# Patient Record
Sex: Male | Born: 1975
Health system: Southern US, Community
[De-identification: ages and names within clinical notes are randomized; demographics above are authoritative.]

## PROBLEM LIST (undated history)

## (undated) DIAGNOSIS — C801 Malignant (primary) neoplasm, unspecified: Secondary | ICD-10-CM

## (undated) HISTORY — PX: APPENDECTOMY: SHX54

## (undated) HISTORY — DX: Malignant (primary) neoplasm, unspecified: C80.1

---

## 2002-06-14 ENCOUNTER — Emergency Department (HOSPITAL_COMMUNITY): Admission: EM | Admit: 2002-06-14 | Discharge: 2002-06-14 | Payer: Self-pay | Admitting: Emergency Medicine

## 2003-02-12 ENCOUNTER — Encounter (INDEPENDENT_AMBULATORY_CARE_PROVIDER_SITE_OTHER): Payer: Self-pay | Admitting: *Deleted

## 2003-02-12 ENCOUNTER — Inpatient Hospital Stay (HOSPITAL_COMMUNITY): Admission: RE | Admit: 2003-02-12 | Discharge: 2003-02-18 | Payer: Self-pay | Admitting: General Surgery

## 2003-02-26 ENCOUNTER — Ambulatory Visit (HOSPITAL_COMMUNITY): Admission: RE | Admit: 2003-02-26 | Discharge: 2003-02-26 | Payer: Self-pay | Admitting: Oncology

## 2003-02-26 ENCOUNTER — Encounter: Payer: Self-pay | Admitting: Oncology

## 2003-03-02 ENCOUNTER — Ambulatory Visit (HOSPITAL_BASED_OUTPATIENT_CLINIC_OR_DEPARTMENT_OTHER): Admission: RE | Admit: 2003-03-02 | Discharge: 2003-03-02 | Payer: Self-pay | Admitting: General Surgery

## 2003-03-02 ENCOUNTER — Encounter: Payer: Self-pay | Admitting: General Surgery

## 2003-06-22 ENCOUNTER — Ambulatory Visit (HOSPITAL_COMMUNITY): Admission: RE | Admit: 2003-06-22 | Discharge: 2003-06-22 | Payer: Self-pay | Admitting: Oncology

## 2003-06-22 ENCOUNTER — Encounter: Payer: Self-pay | Admitting: Oncology

## 2003-06-23 ENCOUNTER — Ambulatory Visit (HOSPITAL_COMMUNITY): Admission: RE | Admit: 2003-06-23 | Discharge: 2003-06-23 | Payer: Self-pay | Admitting: Oncology

## 2003-06-23 ENCOUNTER — Encounter: Payer: Self-pay | Admitting: Oncology

## 2003-10-09 ENCOUNTER — Ambulatory Visit (HOSPITAL_COMMUNITY): Admission: RE | Admit: 2003-10-09 | Discharge: 2003-10-09 | Payer: Self-pay | Admitting: Oncology

## 2003-11-01 ENCOUNTER — Inpatient Hospital Stay (HOSPITAL_COMMUNITY): Admission: EM | Admit: 2003-11-01 | Discharge: 2003-11-03 | Payer: Self-pay | Admitting: Emergency Medicine

## 2004-02-08 ENCOUNTER — Ambulatory Visit (HOSPITAL_COMMUNITY): Admission: RE | Admit: 2004-02-08 | Discharge: 2004-02-08 | Payer: Self-pay | Admitting: Oncology

## 2005-01-12 ENCOUNTER — Ambulatory Visit: Payer: Self-pay | Admitting: Oncology

## 2006-01-11 ENCOUNTER — Ambulatory Visit: Payer: Self-pay | Admitting: Oncology

## 2006-03-29 ENCOUNTER — Ambulatory Visit: Payer: Self-pay | Admitting: Oncology

## 2007-01-07 ENCOUNTER — Ambulatory Visit: Payer: Self-pay | Admitting: Oncology

## 2007-01-10 LAB — CBC WITH DIFFERENTIAL/PLATELET
Basophils Absolute: 0 10*3/uL (ref 0.0–0.1)
Eosinophils Absolute: 0.2 10*3/uL (ref 0.0–0.5)
HCT: 43.5 % (ref 38.7–49.9)
HGB: 15.4 g/dL (ref 13.0–17.1)
LYMPH%: 37.8 % (ref 14.0–48.0)
MCV: 87.3 fL (ref 81.6–98.0)
MONO%: 7.9 % (ref 0.0–13.0)
NEUT#: 3.4 10*3/uL (ref 1.5–6.5)
NEUT%: 51 % (ref 40.0–75.0)
Platelets: 271 10*3/uL (ref 145–400)

## 2007-01-10 LAB — COMPREHENSIVE METABOLIC PANEL
Alkaline Phosphatase: 66 U/L (ref 39–117)
BUN: 17 mg/dL (ref 6–23)
Glucose, Bld: 98 mg/dL (ref 70–99)
Total Bilirubin: 0.4 mg/dL (ref 0.3–1.2)

## 2007-02-02 ENCOUNTER — Inpatient Hospital Stay (HOSPITAL_COMMUNITY): Admission: EM | Admit: 2007-02-02 | Discharge: 2007-02-07 | Payer: Self-pay | Admitting: Emergency Medicine

## 2007-02-02 ENCOUNTER — Encounter (INDEPENDENT_AMBULATORY_CARE_PROVIDER_SITE_OTHER): Payer: Self-pay | Admitting: Specialist

## 2007-02-15 ENCOUNTER — Ambulatory Visit (HOSPITAL_COMMUNITY): Admission: RE | Admit: 2007-02-15 | Discharge: 2007-02-15 | Payer: Self-pay | Admitting: General Surgery

## 2007-02-15 ENCOUNTER — Inpatient Hospital Stay (HOSPITAL_COMMUNITY): Admission: EM | Admit: 2007-02-15 | Discharge: 2007-02-21 | Payer: Self-pay | Admitting: Emergency Medicine

## 2007-12-18 ENCOUNTER — Ambulatory Visit: Payer: Self-pay | Admitting: Oncology

## 2007-12-18 LAB — COMPREHENSIVE METABOLIC PANEL
Albumin: 4.6 g/dL (ref 3.5–5.2)
Alkaline Phosphatase: 72 U/L (ref 39–117)
BUN: 16 mg/dL (ref 6–23)
CO2: 23 mEq/L (ref 19–32)
Glucose, Bld: 84 mg/dL (ref 70–99)
Potassium: 4.1 mEq/L (ref 3.5–5.3)

## 2007-12-18 LAB — CBC WITH DIFFERENTIAL/PLATELET
Basophils Absolute: 0 10*3/uL (ref 0.0–0.1)
Eosinophils Absolute: 0.1 10*3/uL (ref 0.0–0.5)
HGB: 15.1 g/dL (ref 13.0–17.1)
LYMPH%: 41.2 % (ref 14.0–48.0)
MCV: 87.2 fL (ref 81.6–98.0)
MONO#: 0.4 10*3/uL (ref 0.1–0.9)
MONO%: 6.4 % (ref 0.0–13.0)
NEUT#: 3.3 10*3/uL (ref 1.5–6.5)
Platelets: 298 10*3/uL (ref 145–400)
RDW: 13.1 % (ref 11.2–14.6)
WBC: 6.5 10*3/uL (ref 4.0–10.0)

## 2007-12-18 LAB — LACTATE DEHYDROGENASE: LDH: 124 U/L (ref 94–250)

## 2008-06-17 IMAGING — CT CT ABD-PELV W/O CM
2 of 4 series · 15 of 42 positions shown, 19 images · non-contrast
Comparison: NONE

CLINICAL DATA: Right lower quadrant pain.  Evaluate for 
appendicitis.  

CT ABDOMEN AND PELVIS WITHOUT INTRAVENOUS OR ORAL CONTRAST
TECHNIQUE: Multiple axial 3 millimeter thick slices at 3 
millimeter increments were obtained from the lung base through the 
pelvis.

[Series 2: wo · axial · 0.76mm/px · z∈[+459,+882]mm · 12 of 157 slices shown, 16 images]
[im 8/157  soft-tissue]
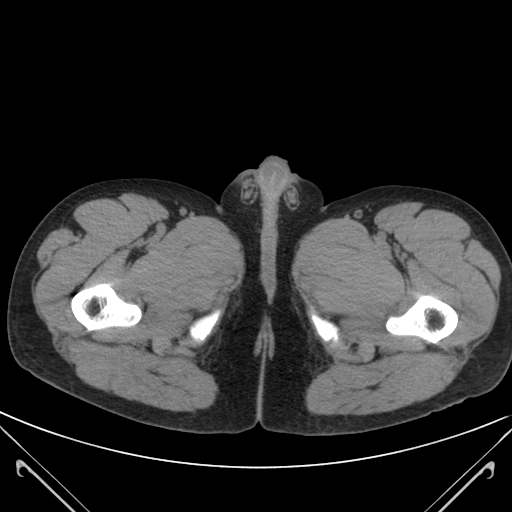
[im 8/157  bone]
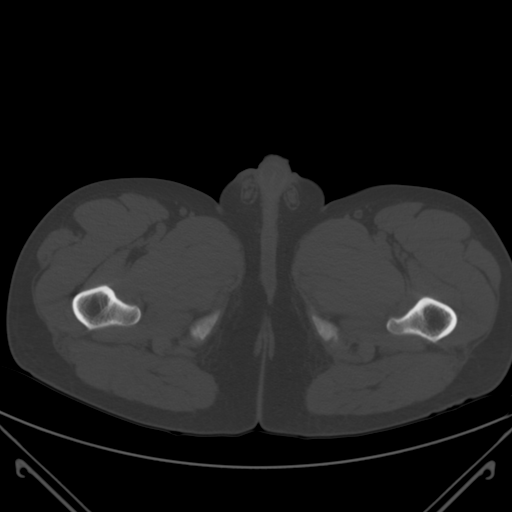
[im 24/157  soft-tissue]
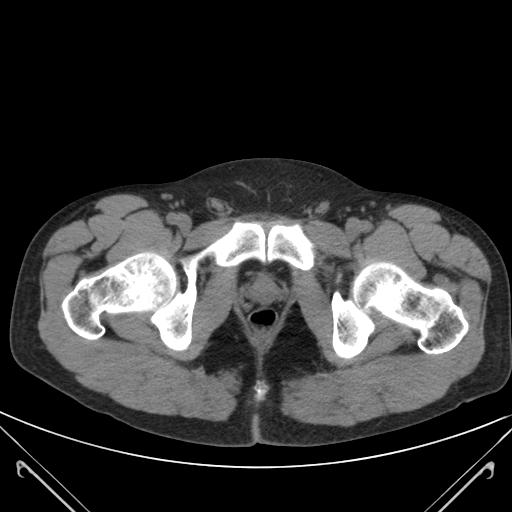
[im 40/157  soft-tissue]
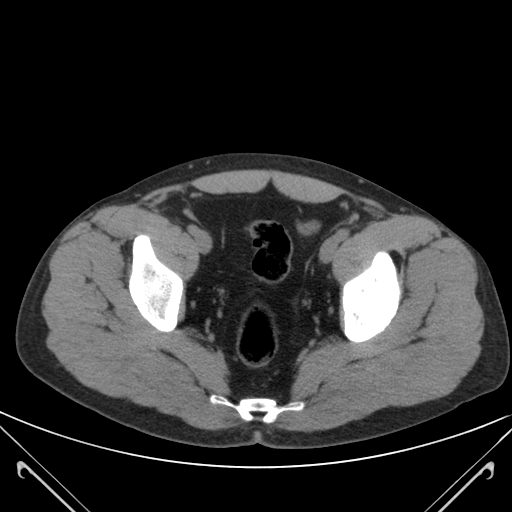
[im 55/157  soft-tissue]
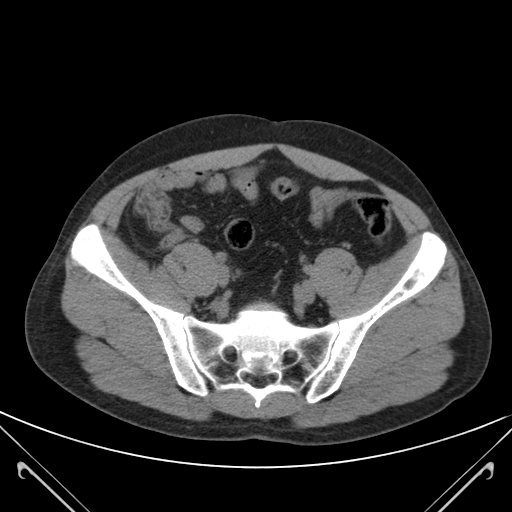
[im 71/157  soft-tissue]
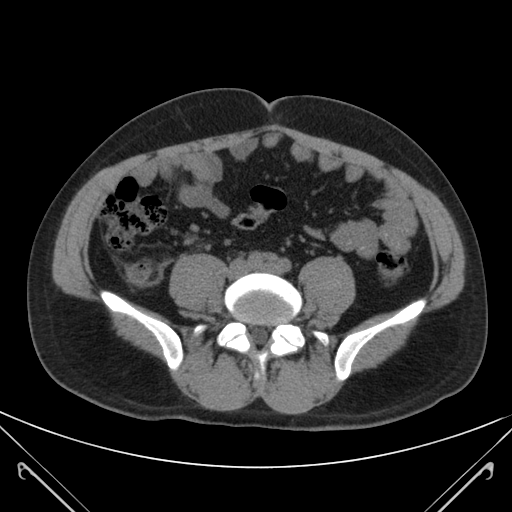
[im 86/157  soft-tissue]
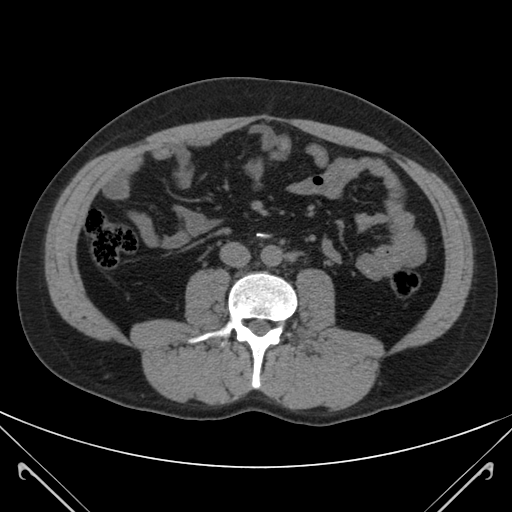
[im 102/157  soft-tissue]
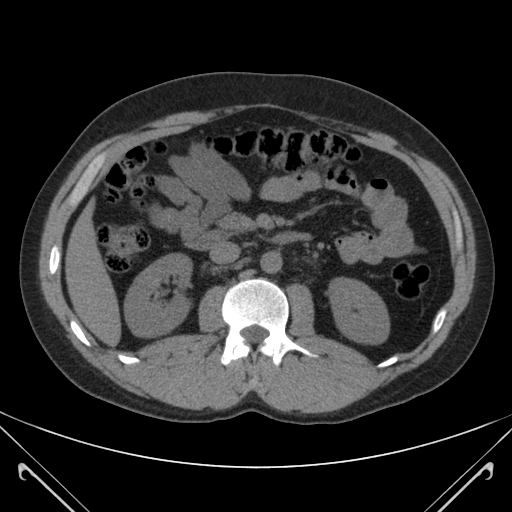
[im 118/157  soft-tissue]
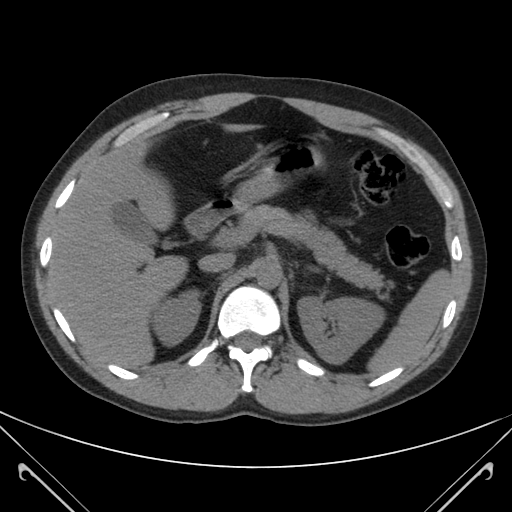
[im 125/157  lung]
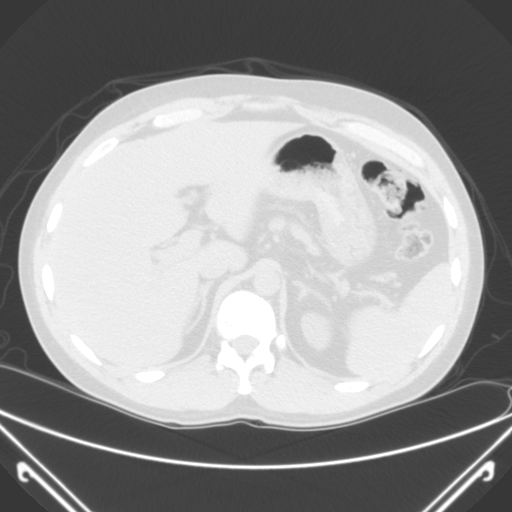
[im 133/157  soft-tissue]
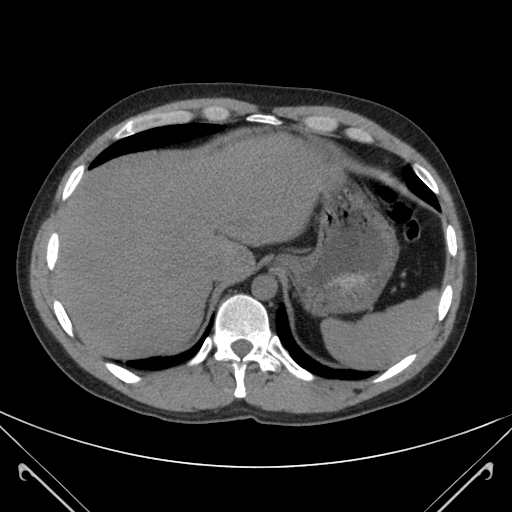
[im 133/157  lung]
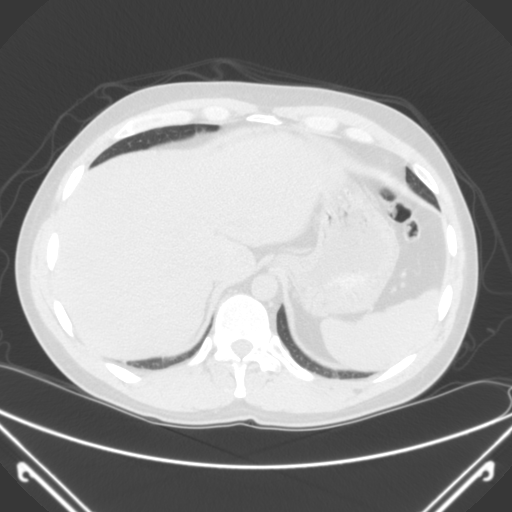
[im 133/157  bone]
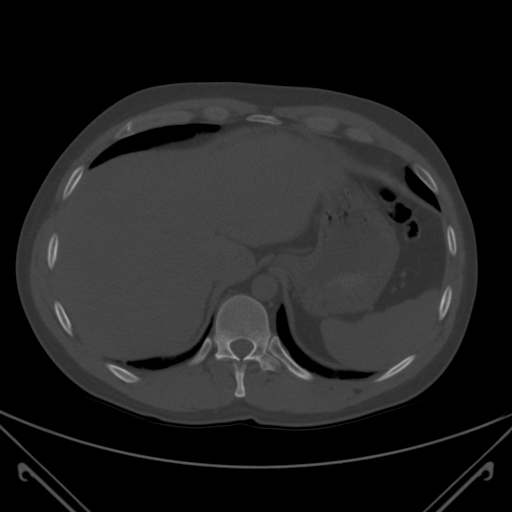
[im 141/157  lung]
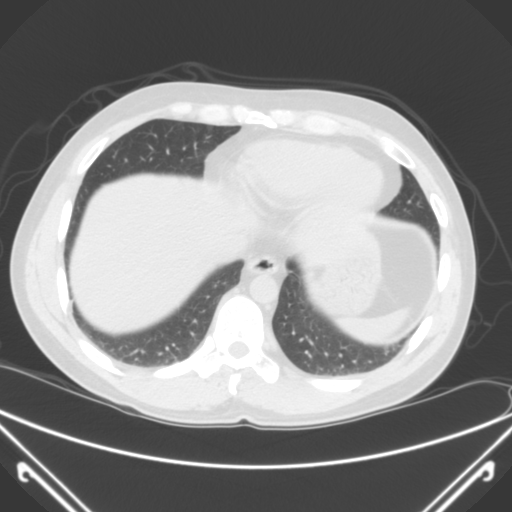
[im 149/157  soft-tissue]
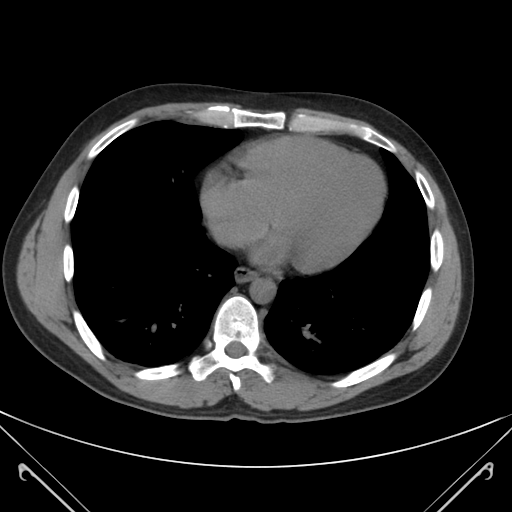
[im 149/157  lung]
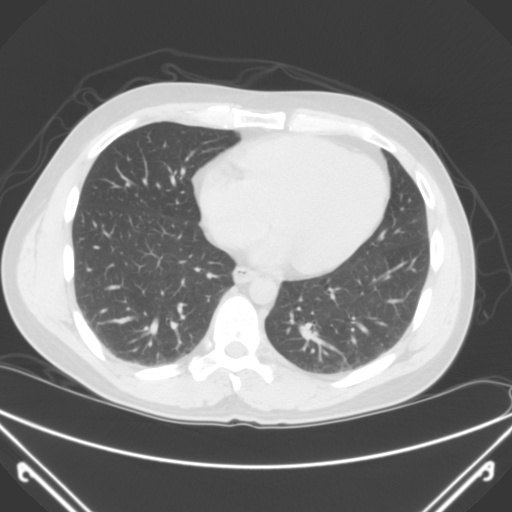

[Series 8038: coronals · coronal · 0.91mm/px · 3 of 79 slices shown]
[im 27/79  soft-tissue]
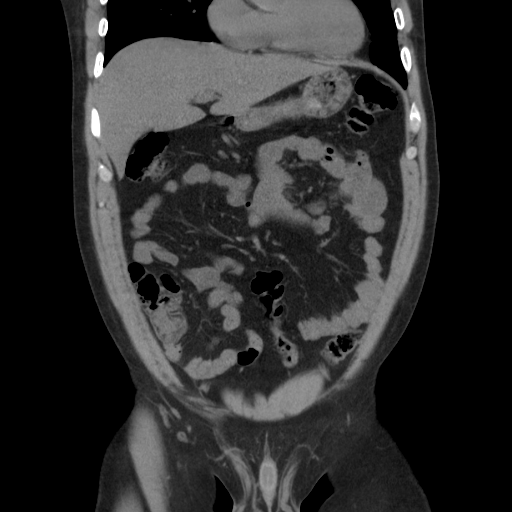
[im 35/79  soft-tissue]
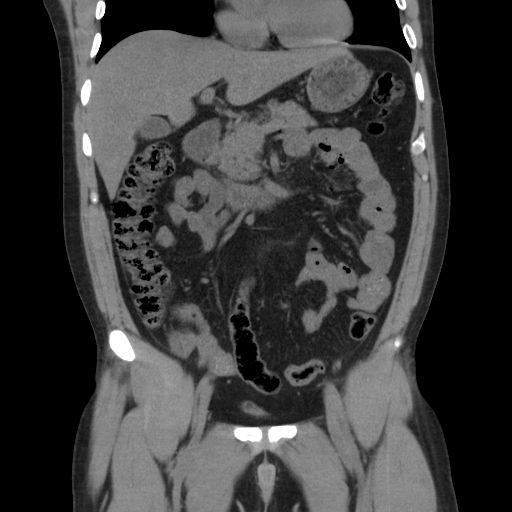
[im 44/79  soft-tissue]
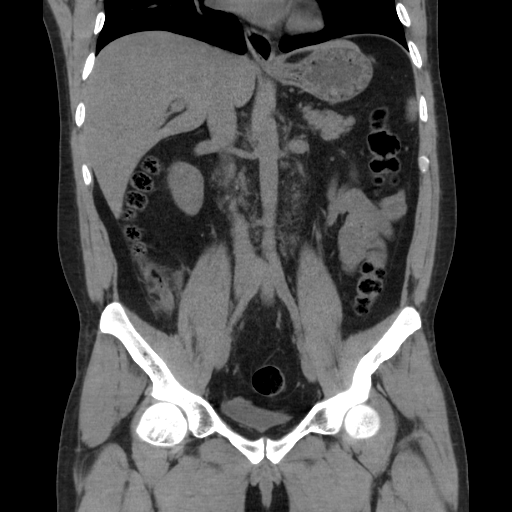

[15 of 42 positions shown; findings below may reference images not displayed]

FINDINGS: No gallstones or renal calculi.  The liver, pancreas, 
and spleen are unremarkable.  No upper abdominal or 
retroperitoneal mass, adenopathy, or aneurysm. No evidence of 
adrenal or renal mass or renal obstruction. The appendix is 
dilated and demonstrates extensive periappendiceal stranding as 
well as a probable small periappendiceal abscess.  No evidence of 
diverticulitis, hernia, or bowel obstruction.  No lung base mass, 
infiltrate, edema, or effusion.  No lytic or blastic lesions.
IMPRESSION: Findings compatible with appendicitis with small 
periappendiceal abscess.  The abscess collection measures 
reviewed on 02/04/2007 Dict Date: 02/04/2007  Tran Date: 
02/04/2007 DAS  JLM

## 2010-12-31 ENCOUNTER — Encounter: Payer: Self-pay | Admitting: Internal Medicine

## 2011-01-01 ENCOUNTER — Encounter: Payer: Self-pay | Admitting: Oncology

## 2011-04-28 NOTE — H&P (Signed)
[name], [name] NO.:  [id number]   MEDICAL RECORD NO.:  [id number]          PATIENT TYPE:  INP   LOCATION:  5710                         FACILITY:  [facility]   PHYSICIAN:  Cherylynn Ridges, M.D.    DATE OF BIRTH:  25-Oct-1976   DATE OF ADMISSION:  02/15/2007  DATE OF DISCHARGE:                              HISTORY & PHYSICAL   IDENTIFICATION/CHIEF COMPLAINT:  The patient is a 35 year old gentleman  who underwent a laparoscopic appendectomy on February 02, 2007, now  being readmitted with a right lower quadrant abscess.   HISTORY OF PRESENT ILLNESS:  The patient has been having right lower  quadrant pain since the time of surgery.  It has worsened over the last  several days to where he came into the urgent office of [facility]  [facility] today, was evaluated, and found to have what was thought to be  some burning with urination and dysuria.  He also said he had very foul-  smelling urine.  He was sent over for UA with micro and a CBC with  differential.  Those results were called in to me.  His UA was  unremarkable.  However, he had a white count of 19.5 thousand.  His  hemoglobin/hematocrit only slightly low.  He was sent for a CT scan  which demonstrated a right lower quadrant abscess collection, and I am  admitting the patient for IV antibiotics and probable percutaneous  drainage using CT guidance tomorrow.   PAST MEDICAL HISTORY:  Significant for:  1. Nodular sclerosing clots and Hodgkin disease, diagnosis in 2004,      through an open excisional biopsy of a large retroperitoneal mass.      a.     He has gone though chemotherapy and apparently is clear of       that disease.  2. He has also had a ruptured appendix, treated on February 02, 2007.   He takes no medications.   HE HAS NO KNOWN DRUG ALLERGIES.   REVIEW OF SYSTEMS:  He ate normally, had a normal bowel movement while  in the emergency room today, otherwise has been having fevers up to 101  at home with some chills.   PHYSICAL EXAMINATION:  GENERAL:  He looks somewhat sickly and pale but  not in a lot of acute distress.  VITAL SIGNS:  His pulse is 110, blood pressure is normal, temperature  101.9.  HEENT:  He is pale, normocephalic and atraumatic.  He has no scleral  icterus.  NECK:  Supple.  He has no cervical adenopathy.  LUNGS:  Clear to auscultation.  CARDIAC:  Tachycardiac with no murmurs.  ABDOMEN:  Distended with hypoactive but present bowel sounds, and a  fullness and palpable mass in the right lower quadrant in the area where  he has the abscess on CT.  RECTAL: Not performed.  NEUROLOGIC:  Cranial nerves II-XII grossly intact.   The only laboratory studies available at this time is a CBC with diff,  showing a white count of 19,000, hemoglobin of 13, hematocrit of 37.   I have reviewed the CT scan  which shows multiloculated fluid collections  in the right lower quadrant with one large and one predominantly.  This  is amenable to percutaneous drainage per Dr. Kearney Hard in interventional  radiology.   IMPRESSION:  Right lower quadrant abscess, status post laparoscopic  appendectomy for purportedly ruptured appendix.   PLAN:  Admit the patient for IV antibiotics, IV hydration, pain control,  and subsequent percutaneous drainage per radiology.  I have explained  this to the patient and his family, who actually had wished to go over  to [facility], however, I told them that they had no beds  available for them to do so.  His main reason for wanted to go over  there was primarily because it is close to the home of his family.      Cherylynn Ridges, M.D.  Electronically Signed     JOW/MEDQ  D:  02/15/2007  T:  02/16/2007  Job:  [id number]

## 2011-04-28 NOTE — Op Note (Signed)
[name], [name] NO.:  [id number]   MEDICAL RECORD NO.:  [id number]          PATIENT TYPE:  INP   LOCATION:  2550                         FACILITY:  [facility]   PHYSICIAN:  Velora Heckler, MD      DATE OF BIRTH:  15-Oct-1976   DATE OF PROCEDURE:  02/02/2007  DATE OF DISCHARGE:                               OPERATIVE REPORT   PREOPERATIVE DIAGNOSIS:  Acute appendicitis.   POSTOPERATIVE DIAGNOSIS:  Acute appendicitis with perforation.   PROCEDURE:  Laparoscopic appendectomy.   SURGEON:  Velora Heckler, M.D., FACS   ANESTHESIA:  General per Dr. Jairo Ben.   ESTIMATED BLOOD LOSS:  Minimal.   PREPARATION:  Betadine.   COMPLICATIONS:  None.   INDICATIONS:  The patient is a 35 year old male [place] who  presents with a 24-hour history of abdominal pain localized to the right  lower quadrant.  He was initially seen at [facility].  CT scan  of the abdomen and pelvis at [facility] demonstrated  findings consistent with acute appendicitis.  The patient was sent to  [facility] for surgical evaluation and management.  The patient  was seen and evaluated in the emergency room and prepared for the  operating room.   DESCRIPTION OF PROCEDURE:  The procedure was done in OR #16 at the [facility].  The patient is brought to the operating room  and placed in a supine position on the operating room table.  Following  administration of general anesthesia, the patient is prepped and draped  in the usual strict aseptic fashion.   After ascertaining that an adequate level of anesthesia had been  obtained, the patient's previous midline scar is reopened with a #15  blade for 3 cm just above the umbilicus in the midline.  Dissection is  carried down through scar tissue and subcutaneous tissues to the fascia.  The fascia is incised, and the peritoneal cavity is entered cautiously.  A 0 Vicryl pursestring suture  is placed in the fascia.  An Hasson  cannula is introduced and secured with a pursestring suture.  The  abdomen is insufflated with carbon dioxide.  The laparoscope is  introduced and the abdomen explored.  There appears to be one adhesion  of omentum to the anterior abdominal wall.  Operative ports are placed  in the right upper quadrant and left lower quadrant.  Adhesions are  taken down.  The cecum is then gently mobilized.  The appendix is  visible, although it tracks retrocecally along the lateral colic gutter  in a retroperitoneal location.  Using the harmonic scalpel, the  retroperitoneum is opened lateral to the appendix.  The base of the  appendix is mobilized.  A window is created at the base of the appendix,  and the base of the appendix is transected with an Endo-GIA stapler  using a vascular cartridge.  The mesoappendix is then taken down near  the wall of the appendix with the harmonic scalpel.  Care is taken to  avoid the cecal wall.  The peritoneum is opened along the  edge of the  appendix.  At  approximately the midportion of the appendix, however,  there is a collection of purulent fluid which erupts with opening of the  retroperitoneum.  This is aspirated and evacuated.  The appendix, with  gentle traction, actually separates into two pieces at this point.  The  initial proximal segment of the appendix is then placed in an EndoCatch  bag and withdrawn from the peritoneal cavity.  The remaining distal  extent of the appendix is grasped with a grasper and elevated.  Using  the harmonic scalpel, it is dissected out of the retroperitoneum in its  entirety and placed into an EndoCatch bag and withdrawn from the  peritoneal cavity.  The operative field is irrigated with warm saline.  Good hemostasis is noted.  Two liters of irrigation are used to irrigate  the operative site.  A large amount of this is evacuated.  A 19-French  Blake drain is then brought in through the right  upper quadrant stab  wound and placed within the operative field along the pelvis, the base  of the cecum, and the lateral colic gutter.  The drain is secured to the  skin with a 3-0 nylon suture and placed to bulb suction.  The remainder  of the abdomen is irrigated and irrigation fluid evacuated.  The ports  are removed under direct vision.  Pneumoperitoneum is released.  The  0  Vicryl pursestring suture is tied securely.  The port sites are  anesthetized with local anesthetic.  The wounds are closed with  interrupted 4-0 Vicryl subcuticular sutures.  The wounds are washed and  dried, and benzoin and Steri-Strips are applied.  Sterile dressings are  applied.   The patient is awakened from anesthesia and brought to the recovery room  in stable condition.  The patient tolerated the procedure well.      Velora Heckler, MD  Electronically Signed     TMG/MEDQ  D:  02/02/2007  T:  02/03/2007  Job:  [id number]

## 2011-04-28 NOTE — Discharge Summary (Signed)
[name], RENSTROM NO.:  [id number]   MEDICAL RECORD NO.:  [id number]          PATIENT TYPE:  INP   LOCATION:  5030                         FACILITY:  [facility]   PHYSICIAN:  Velora Heckler, MD      DATE OF BIRTH:  06/12/1976   DATE OF ADMISSION:  02/02/2007  DATE OF DISCHARGE:  02/07/2007                               DISCHARGE SUMMARY   REASON FOR ADMISSION:  Acute appendicitis.   HISTORY OF PRESENT ILLNESS:  The patient is a 35 year old male from [place], presenting to the emergency room with a 24-hour history of  abdominal pain, localizing to the right lower quadrant.  [facility]  [facility] scan at [facility]  demonstrated findings consistent with acute appendicitis.  The patient  was referred to [facility] for surgical management.   HOSPITAL COURSE:  The patient was seen and evaluated in the emergency  department.  He was admitted and started on intravenous antibiotics.  The patient was prepared and taken to the operating room on February 02, 2007, where he underwent a laparoscopic appendectomy for acute  appendicitis with perforation.   His postoperative course was relatively straightforward.  The patient  did have a fever.  He received intravenous antibiotics.  He was started  on a clear liquid diet.  He was advanced to a full liquid diet on the  third postoperative day.  The patient was seen in consultation by Dr.  Valentino Hue. Magrinat from medical oncology, to do his history of Hodgkin's  lymphoma.  The patient continued steady progress and was prepared for  discharge home on the fifth postoperative day.   DISCHARGE PLANNING:  The patient is discharged home on February 07, 2007, in good condition, tolerating a regular diet and ambulating  independently.  The patient will be seen back in our office at [facility] in two weeks.   DISCHARGE MEDICATIONS:  1. Augmentin 875 mg bid for  five days.  2. Vicodin p.r.n. pain.   DISCHARGE DIAGNOSES:  Acute suppurative appendicitis with perforation.   CONDITION ON DISCHARGE:  Improved.      Velora Heckler, MD  Electronically Signed     TMG/MEDQ  D:  03/12/2007  T:  03/12/2007  Job:  [id number]

## 2015-07-18 ENCOUNTER — Other Ambulatory Visit: Payer: Self-pay | Admitting: Physician Assistant

## 2015-07-18 ENCOUNTER — Ambulatory Visit (INDEPENDENT_AMBULATORY_CARE_PROVIDER_SITE_OTHER): Payer: Self-pay

## 2015-07-18 ENCOUNTER — Ambulatory Visit (INDEPENDENT_AMBULATORY_CARE_PROVIDER_SITE_OTHER): Payer: Self-pay | Admitting: Family Medicine

## 2015-07-18 VITALS — BP 110/60 | HR 97 | Temp 98.3°F | Resp 18 | Ht 66.0 in | Wt 160.2 lb

## 2015-07-18 DIAGNOSIS — R519 Headache, unspecified: Secondary | ICD-10-CM

## 2015-07-18 DIAGNOSIS — H21562 Pupillary abnormality, left eye: Secondary | ICD-10-CM

## 2015-07-18 DIAGNOSIS — R51 Headache: Secondary | ICD-10-CM

## 2015-07-18 DIAGNOSIS — R52 Pain, unspecified: Secondary | ICD-10-CM

## 2015-07-18 LAB — PROTIME-INR
INR: 0.98 (ref <1.50)
Prothrombin Time: 13 seconds (ref 11.6–15.2)

## 2015-07-18 MED ORDER — IBUPROFEN 800 MG PO TABS: ORAL_TABLET | ORAL | Status: DC
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
## 2015-07-25 NOTE — Progress Notes (Signed)
Patient ID: [name], male   DOB: 1976-09-21, 39 y.o.   MRN: [id number] Pt independently examined by myself and Dr. Ouida Sills. Reviewed documentation and xray and agree w/ assessment and plan. Delman Cheadle, MD MPH

## 2018-03-29 ENCOUNTER — Encounter: Payer: Self-pay | Admitting: Urgent Care

## 2018-04-04 ENCOUNTER — Emergency Department (HOSPITAL_COMMUNITY)
Admission: EM | Admit: 2018-04-04 | Discharge: 2018-04-04 | Discharge status: Absent without leave | Payer: BLUE CROSS/BLUE SHIELD

## 2018-04-04 ENCOUNTER — Ambulatory Visit: Payer: Self-pay | Admitting: Family Medicine

## 2018-04-04 DIAGNOSIS — J09X9 Influenza due to identified novel influenza A virus with other manifestations: Secondary | ICD-10-CM | POA: Diagnosis not present

## 2018-04-05 ENCOUNTER — Ambulatory Visit: Payer: Self-pay | Admitting: *Deleted

## 2018-04-05 NOTE — Telephone Encounter (Signed)
Pt's son called to report pt "has fever." Pt on line with son who is translating.  Pt states he was seen in [facility] and diagnosed with the flu. Unable to find documentation relating to this. States he was given medication to take; unsure of name.  Reports presently "feels" warm, does not have thermometer. Reports diarrhea past 2 days, "none today." Intermittent stomach pain, 9/16 at umbilicus. Denies nausea, vomiting. Denies any cough/congestion, no SOB. Appt made for tomorrow with Dr. Mitchel Honour. Care advise given per protocol as well as instructions to stay hydrated, eat bland diet.  Reason for Disposition . [1] MODERATE pain (e.g., interferes with normal activities) AND [2] pain comes and goes (cramps) AND [3] present > 24 hours  (Exception: pain with Vomiting or Diarrhea - see that Guideline)  Answer Assessment - Initial Assessment Questions 1. LOCATION: "Where does it hurt?"      At umbilicus 2. RADIATION: "Does the pain shoot anywhere else?" (e.g., chest, back)     no 3. ONSET: "When did the pain begin?" (Minutes, hours or days ago)      2 days ago 4. SUDDEN: "Gradual or sudden onset?"     gradual 5. PATTERN "Does the pain come and go, or is it constant?"    - If constant: "Is it getting better, staying the same, or worsening?"      (Note: Constant means the pain never goes away completely; most serious pain is constant and it progresses)     - If intermittent: "How long does it last?" "Do you have pain now?"     (Note: Intermittent means the pain goes away completely between bouts)    Comes and goes 6. SEVERITY: "How bad is the pain?"  (e.g., Scale 1-10; mild, moderate, or severe)    - MILD (1-3): doesn't interfere with normal activities, abdomen soft and not tender to touch     - MODERATE (4-7): interferes with normal activities or awakens from sleep, tender to touch     - SEVERE (8-10): excruciating pain, doubled over, unable to do any normal activities      5/10 7.  RECURRENT SYMPTOM: "Have you ever had this type of abdominal pain before?" If so, ask: "When was the last time?" and "What happened that time?"      unsure 8. CAUSE: "What do you think is causing the abdominal pain?"     unsure 9. RELIEVING/AGGRAVATING FACTORS: "What makes it better or worse?" (e.g., movement, antacids, bowel movement)      10. OTHER SYMPTOMS: "Has there been any vomiting, diarrhea, constipation, or urine problems?"       Diarrhea x 2 days, but "None today"  "Feels warm"  Protocols used: ABDOMINAL PAIN - MALE-A-AH

## 2018-04-06 ENCOUNTER — Ambulatory Visit: Payer: BLUE CROSS/BLUE SHIELD | Admitting: Emergency Medicine

## 2018-04-06 ENCOUNTER — Encounter: Payer: Self-pay | Admitting: Emergency Medicine

## 2018-04-06 ENCOUNTER — Other Ambulatory Visit: Payer: Self-pay

## 2018-04-06 VITALS — BP 110/60 | HR 94 | Temp 99.5°F | Resp 16 | Ht 66.0 in | Wt 167.6 lb

## 2018-04-06 DIAGNOSIS — J111 Influenza due to unidentified influenza virus with other respiratory manifestations: Secondary | ICD-10-CM | POA: Insufficient documentation

## 2018-04-06 DIAGNOSIS — R1084 Generalized abdominal pain: Secondary | ICD-10-CM | POA: Diagnosis not present

## 2018-04-06 DIAGNOSIS — J302 Other seasonal allergic rhinitis: Secondary | ICD-10-CM | POA: Diagnosis not present

## 2018-04-06 MED ORDER — CETIRIZINE HCL 10 MG PO TABS: 10.0000 mg | ORAL_TABLET | Freq: Every day | ORAL | 11 refills | Status: DC

## 2018-04-06 MED ORDER — TRIAMCINOLONE ACETONIDE 55 MCG/ACT NA AERO: 2.0000 | INHALATION_SPRAY | Freq: Every day | NASAL | 12 refills | Status: DC

## 2018-04-06 NOTE — Progress Notes (Signed)
[name] 42 y.o.   Chief Complaint  Patient presents with  . Abdominal Pain    x 3 days, no vomiting, diarrhea x 1 day, "It's better", was given Tamiflu on 04/04/18    HISTORY OF PRESENT ILLNESS: This is a 43 y.o. male recently diagnosed with influenza and started on Tamiflu 2 days ago when he was seen at an urgent care center.  Was very sick in the first 48 hours.  Feels better today.  Had some abdominal pain with nausea yesterday.  Had a little bit of diarrhea also.  Overall feeling better.  Has a history of seasonal allergies and also a remote history of stomach cancer.  Able to eat and drink.  No vomiting.  Had fever and chills but no longer.  No other significant symptoms.  HPI   Prior to Admission medications   Medication Sig Start Date End Date Taking? Authorizing Provider  acetaminophen (TYLENOL) 500 MG tablet Take 500 mg by mouth every 6 (six) hours as needed.   Yes [provider]  oseltamivir (TAMIFLU) 75 MG capsule Take 75 mg by mouth.   Yes [provider]    No Known Allergies  There are no active problems to display for this patient.   No past medical history on file.    Social History   Socioeconomic History  . Marital status: Single    Spouse name: Not on file  . Number of children: Not on file  . Years of education: Not on file  . Highest education level: Not on file  Occupational History  . Not on file  Social Needs  . Financial resource strain: Not on file  . Food insecurity:    Worry: Not on file    Inability: Not on file  . Transportation needs:    Medical: Not on file    Non-medical: Not on file  Tobacco Use  . Smoking status: Never Smoker  . Smokeless tobacco: Never Used  Substance and Sexual Activity  . Alcohol use: Yes    Alcohol/week: 0.0 oz  . Drug use: Not on file  . Sexual activity: Not on file  Lifestyle  . Physical activity:    Days per week: Not on file    Minutes per session: Not on file    . Stress: Not on file  Relationships  . Social connections:    Talks on phone: Not on file    Gets together: Not on file    Attends religious service: Not on file    Active member of club or organization: Not on file    Attends meetings of clubs or organizations: Not on file    Relationship status: Not on file  . Intimate partner violence:    Fear of current or ex partner: Not on file    Emotionally abused: Not on file    Physically abused: Not on file    Forced sexual activity: Not on file  Other Topics Concern  . Not on file  Social History Narrative  . Not on file    No family history on file.   Review of Systems  Constitutional: Positive for chills, fever and malaise/fatigue.  HENT: Positive for sore throat. Negative for congestion, ear pain and nosebleeds.   Eyes: Negative.  Negative for blurred vision, double vision, discharge and redness.  Respiratory: Positive for cough. Negative for sputum production, shortness of breath and wheezing.   Cardiovascular: Negative.  Negative for chest pain, palpitations and leg swelling.  Gastrointestinal: Positive for abdominal pain, diarrhea and nausea. Negative for blood in stool, melena and vomiting.  Genitourinary: Negative.  Negative for dysuria and hematuria.  Musculoskeletal: Positive for myalgias. Negative for back pain and neck pain.  Skin: Negative.  Negative for rash.  Neurological: Negative.  Negative for dizziness and headaches.  Endo/Heme/Allergies: Negative.   All other systems reviewed and are negative.   Vitals:   04/06/18 0853  BP: 110/60  Pulse: 94  Resp: 16  Temp: 99.5 F (37.5 C)  SpO2: 98%    Physical Exam  Constitutional: He is oriented to person, place, and time. He appears well-developed and well-nourished.  HENT:  Head: Normocephalic and atraumatic.  Right Ear: External ear normal.  Left Ear: External ear normal.  Nose: Nose normal.  Mouth/Throat: Oropharynx is clear and moist.  Eyes: Pupils  are equal, round, and reactive to light. Conjunctivae and EOM are normal.  Neck: Normal range of motion. Neck supple.  Cardiovascular: Normal rate, regular rhythm and normal heart sounds.  Pulmonary/Chest: Effort normal and breath sounds normal.  Abdominal: Soft. Bowel sounds are normal. He exhibits no distension and no mass. There is no tenderness. There is no guarding.  Old vertical surgical scar  Musculoskeletal: Normal range of motion. He exhibits no edema.  Lymphadenopathy:    He has no cervical adenopathy.  Neurological: He is alert and oriented to person, place, and time. No sensory deficit. He exhibits normal muscle tone.  Skin: Skin is warm and dry. Capillary refill takes less than 2 seconds. No rash noted.  Psychiatric: He has a normal mood and affect. His behavior is normal.  Vitals reviewed.   A total of 25 minutes was spent in the room with the patient, greater than 50% of which was in counseling/coordination of care regarding differential diagnosis and understanding of what influenza is and what to expect from it.  ASSESSMENT & PLAN: [name] was seen today for abdominal pain.  Diagnoses and all orders for this visit:  Influenza  Seasonal allergies -     cetirizine (ZYRTEC) 10 MG tablet; Take 1 tablet (10 mg total) by mouth daily. -     triamcinolone (NASACORT) 55 MCG/ACT AERO nasal inhaler; Place 2 sprays into the nose daily.  Generalized abdominal pain Comments: Resolved   Continue on finish Tamiflu.  Return here if worse. Patient Instructions       IF you received an x-ray today, you will receive an invoice from [facility]. Please contact [facility] at [phone] with questions or concerns regarding your invoice.   IF you received labwork today, you will receive an invoice from [place]. Please contact [facility] at [phone] with questions or concerns regarding your invoice.   Our billing staff will not be able to assist you with  questions regarding bills from these companies.  You will be contacted with the lab results as soon as they are available. The fastest way to get your results is to activate your My Chart account. Instructions are located on the last page of this paperwork. If you have not heard from [place] regarding the results in 2 weeks, please contact this office.     Gripe en los adultos (Influenza, Adult) La gripe es una infeccin en los pulmones, la nariz y la garganta (vas respiratorias). La causa un virus. La gripe provoca muchos sntomas del resfro comn, as como fiebre alta y Hydrologist. Puede hacer que se sienta muy mal. Se transmite fcilmente de persona a persona (es contagiosa). La mejor  manera de prevenir la gripe es aplicndose la vacuna contra la gripe todos los aos. CUIDADOS EN EL HOGAR  Tome los medicamentos de venta libre y los recetados solamente como se lo haya indicado el mdico.  Use un humidificador de aire fro para que el aire de su casa est ms hmedo. Esto puede facilitar la respiracin.  Descanse todo lo que sea necesario.  Beba suficiente lquido para mantener el pis (orina) claro o de color amarillo plido.  Al toser o estornudar, cbrase la boca y la [place].  Lvese las manos con agua y jabn frecuentemente, en especial despus de toser o Brewing technologist. Use un desinfectante para manos si no dispone de [place] y [place].  Foy [place] en su casa y no concurra al [place] o a la escuela, como se lo haya indicado el mdico. A menos que deba ir al [employer], evite salir de su casa hasta que no tenga fiebre durante 24horas sin el uso de medicamentos.  Concurra a todas las visitas de control como se lo haya indicado el mdico. Esto es importante.  PREVENCIN  Aplicarse la vacuna anual contra la gripe es la mejor manera de evitar contagiarse la gripe. Puede aplicarse la vacuna contra la gripe a fines de verano, en otoo o en invierno. Pregntele al mdico cundo debe aplicarse la vacuna  contra la gripe.  Lvese las manos o use un desinfectante de manos con frecuencia.  Evite el contacto con personas que estn enfermas durante la temporada de resfro y gripe.  Consuma alimentos saludables.  Beba abundantes lquidos.  Duerma lo suficiente.  Haga ejercicios regularmente.  SOLICITE AYUDA SI:  Tiene sntomas nuevos.  Tiene los siguientes sntomas: ? Tourist information centre manager. ? Deposiciones lquidas (diarrea). ? Fiebre.  La tos empeora.  Empieza a tener ms mucosidad.  Siente malestar estomacal (nuseas).  Vomita.  SOLICITE AYUDA DE INMEDIATO SI:  Siente que le falta el aire o tiene dificultad para Ambulance person.  La piel o las uas se tornan de un color [place].  Presenta un dolor intenso o rigidez en el cuello.  Siente dolor de cabeza de forma repentina.  Le duele la cara o el odo de forma repentina.  No puede detener los vmitos.  Esta informacin no tiene Marine scientist el consejo del mdico. Asegrese de hacerle al mdico cualquier pregunta que tenga. Document Released: 02/23/2009 Document Revised: 03/20/2016 Document Reviewed: 09/21/2015 Elsevier Interactive Patient Education  2017 Elsevier Inc.      Agustina Caroli, MD [facility]

## 2018-04-06 NOTE — Patient Instructions (Addendum)
IF you received an x-ray today, you will receive an invoice from [facility]. Please contact [facility] at [phone] with questions or concerns regarding your invoice.   IF you received labwork today, you will receive an invoice from [place]. Please contact [facility] at [phone] with questions or concerns regarding your invoice.   Our billing staff will not be able to assist you with questions regarding bills from these companies.  You will be contacted with the lab results as soon as they are available. The fastest way to get your results is to activate your My Chart account. Instructions are located on the last page of this paperwork. If you have not heard from [place] regarding the results in 2 weeks, please contact this office.     Gripe en los adultos (Influenza, Adult) La gripe es una infeccin en los pulmones, la nariz y la garganta (vas respiratorias). La causa un virus. La gripe provoca muchos sntomas del resfro comn, as como fiebre alta y Hydrologist. Puede hacer que se sienta muy mal. Se transmite fcilmente de persona a persona (es contagiosa). La mejor manera de prevenir la gripe es aplicndose la vacuna contra la gripe todos los aos. CUIDADOS EN EL HOGAR  Tome los medicamentos de venta libre y los recetados solamente como se lo haya indicado el mdico.  Use un humidificador de aire fro para que el aire de su casa est ms hmedo. Esto puede facilitar la respiracin.  Descanse todo lo que sea necesario.  Beba suficiente lquido para mantener el pis (orina) claro o de color amarillo plido.  Al toser o estornudar, cbrase la boca y la [place].  Lvese las manos con agua y jabn frecuentemente, en especial despus de toser o Brewing technologist. Use un desinfectante para manos si no dispone de [place] y [place].  Foy [place] en su casa y no concurra al [place] o a la escuela, como se lo haya indicado el mdico. A menos que deba ir al [employer], evite salir de su  casa hasta que no tenga fiebre durante 24horas sin el uso de medicamentos.  Concurra a todas las visitas de control como se lo haya indicado el mdico. Esto es importante.  PREVENCIN  Aplicarse la vacuna anual contra la gripe es la mejor manera de evitar contagiarse la gripe. Puede aplicarse la vacuna contra la gripe a fines de verano, en otoo o en invierno. Pregntele al mdico cundo debe aplicarse la vacuna contra la gripe.  Lvese las manos o use un desinfectante de manos con frecuencia.  Evite el contacto con personas que estn enfermas durante la temporada de resfro y gripe.  Consuma alimentos saludables.  Beba abundantes lquidos.  Duerma lo suficiente.  Haga ejercicios regularmente.  SOLICITE AYUDA SI:  Tiene sntomas nuevos.  Tiene los siguientes sntomas: ? Tourist information centre manager. ? Deposiciones lquidas (diarrea). ? Fiebre.  La tos empeora.  Empieza a tener ms mucosidad.  Siente malestar estomacal (nuseas).  Vomita.  SOLICITE AYUDA DE INMEDIATO SI:  Siente que le falta el aire o tiene dificultad para Ambulance person.  La piel o las uas se tornan de un color [place].  Presenta un dolor intenso o rigidez en el cuello.  Siente dolor de cabeza de forma repentina.  Le duele la cara o el odo de forma repentina.  No puede detener los vmitos.  Esta informacin no tiene Marine scientist el consejo del mdico. Asegrese de hacerle al mdico cualquier pregunta que tenga. Document Released: 02/23/2009 Document Revised: 03/20/2016  Document Reviewed: 09/21/2015 Elsevier Interactive Patient Education  2017 [employer].

## 2018-04-20 ENCOUNTER — Encounter: Payer: Self-pay | Admitting: Urgent Care

## 2018-04-20 ENCOUNTER — Ambulatory Visit (INDEPENDENT_AMBULATORY_CARE_PROVIDER_SITE_OTHER): Payer: BLUE CROSS/BLUE SHIELD | Admitting: Urgent Care

## 2018-04-20 VITALS — BP 102/60 | HR 73 | Temp 97.7°F | Ht 66.54 in | Wt 166.4 lb

## 2018-04-20 DIAGNOSIS — Z859 Personal history of malignant neoplasm, unspecified: Secondary | ICD-10-CM

## 2018-04-20 DIAGNOSIS — Z113 Encounter for screening for infections with a predominantly sexual mode of transmission: Secondary | ICD-10-CM | POA: Diagnosis not present

## 2018-04-20 DIAGNOSIS — Z13220 Encounter for screening for lipoid disorders: Secondary | ICD-10-CM | POA: Diagnosis not present

## 2018-04-20 DIAGNOSIS — Z13228 Encounter for screening for other metabolic disorders: Secondary | ICD-10-CM

## 2018-04-20 DIAGNOSIS — Z0000 Encounter for general adult medical examination without abnormal findings: Secondary | ICD-10-CM | POA: Diagnosis not present

## 2018-04-20 DIAGNOSIS — Z1329 Encounter for screening for other suspected endocrine disorder: Secondary | ICD-10-CM

## 2018-04-20 DIAGNOSIS — Z114 Encounter for screening for human immunodeficiency virus [HIV]: Secondary | ICD-10-CM | POA: Diagnosis not present

## 2018-04-20 DIAGNOSIS — Z130 Encounter for screening for diseases of the blood and blood-forming organs and certain disorders involving the immune mechanism: Secondary | ICD-10-CM | POA: Diagnosis not present

## 2018-04-20 DIAGNOSIS — B351 Tinea unguium: Secondary | ICD-10-CM

## 2018-04-20 MED ORDER — KETOCONAZOLE 2 % EX CREA: 1.0000 "application " | TOPICAL_CREAM | Freq: Every day | CUTANEOUS | 0 refills | Status: AC

## 2018-04-20 NOTE — Progress Notes (Signed)
MRN: [id number]  Subjective:   Mr. Tylek Boney Rios-Cruz is a 42 y.o. male presenting for annual physical exam. He is happily married, has 2 children. Works in Architect. Has good relationships at home, has a good support network.  Has occasional beer (up to a 6 pack in a sitting) at parties, not every weekend or monthly.  Medical care team includes: PCP: Patient, No Pcp Per Vision: No visual deficits. Specialists: None.  Health Maintenance: Needs his Tdap updated but patient states that he does not like needles and is very much opposed to this.  Blanche currently has no medications in their medication list. He has No Known Allergies. Riddick  has a past medical history of Cancer ([place]). Also  has a past surgical history that includes Appendectomy. Denies family history of cancer, diabetes, HTN, HL, heart disease, stroke, mental illness.   Review of Systems  Constitutional: Negative for chills, diaphoresis, fever, malaise/fatigue and weight loss.  HENT: Negative for congestion, ear discharge, ear pain, hearing loss, nosebleeds, sore throat and tinnitus.   Eyes: Negative for blurred vision, double vision, photophobia, pain, discharge and redness.  Respiratory: Negative for cough, shortness of breath and wheezing.   Cardiovascular: Negative for chest pain, palpitations and leg swelling.  Gastrointestinal: Negative for abdominal pain, blood in stool, constipation, diarrhea, nausea and vomiting.  Genitourinary: Negative for dysuria, flank pain, frequency, hematuria and urgency.  Musculoskeletal: Negative for back pain, joint pain and myalgias.  Skin: Positive for rash (great toenails). Negative for itching.  Neurological: Negative for dizziness, tingling, seizures, loss of consciousness, weakness and headaches.  Endo/Heme/Allergies: Negative for polydipsia.  Psychiatric/Behavioral: Negative for depression, hallucinations, memory loss, substance abuse and suicidal ideas. The patient  is nervous/anxious (situational). The patient does not have insomnia.    Objective:   Vitals: BP 102/60 (BP Location: Left Arm, Patient Position: Sitting, Cuff Size: Normal)   Pulse 73   Temp 97.7 F (36.5 C) (Oral)   Ht 5' 6.54" (1.69 m)   Wt 166 lb 6.4 oz (75.5 kg)   SpO2 99%   BMI 26.43 kg/m    Visual Acuity Screening   Right eye Left eye Both eyes  Without correction: 20/20-2 20/15-2 20/15-2  With correction:       Physical Exam  Constitutional: He is oriented to person, place, and time. He appears well-developed and well-nourished.  HENT:  TM's intact bilaterally, no effusions or erythema. Nasal turbinates pink and moist, nasal passages patent. No sinus tenderness. Oropharynx clear, mucous membranes moist, dentition in good repair.  Eyes: Pupils are equal, round, and reactive to light. Conjunctivae and EOM are normal. Right eye exhibits no discharge. Left eye exhibits no discharge. No scleral icterus.  Neck: Normal range of motion. Neck supple. No thyromegaly present.  Cardiovascular: Normal rate, regular rhythm and intact distal pulses. Exam reveals no gallop and no friction rub.  No murmur heard. Pulmonary/Chest: No stridor. No respiratory distress. He has no wheezes. He has no rales.  Abdominal: Soft. Bowel sounds are normal. He exhibits no distension and no mass. There is no tenderness.  Musculoskeletal: Normal range of motion. He exhibits no edema or tenderness.  Lymphadenopathy:    He has no cervical adenopathy.  Neurological: He is alert and oriented to person, place, and time. He has normal reflexes.  Skin: Skin is warm and dry. No rash noted. No erythema. No pallor.  Hyperkeratotic toenails for his great toenails bilaterally.  The left toenail is actually not completely attached.  He also  has dry scaly skin over medial portion of heels bilaterally.  Psychiatric: He has a normal mood and affect.   Assessment and Plan :   Annual physical exam - Plan: CANCELED:  Microalbumin, urine  Screening for deficiency anemia - Plan: CBC  Screening cholesterol level - Plan: Lipid panel  Screening for metabolic disorder - Plan: Comprehensive metabolic panel  Screening for thyroid disorder - Plan: TSH  Screening for HIV (human immunodeficiency virus) - Plan: HIV antibody  History of cancer  Routine screening for STI (sexually transmitted infection) - Plan: GC/Chlamydia Probe Amp([facility]), Trichomonas vaginalis, RNA, RPR  Onychomycosis of great toe  Patient is medically stable, labs pending. Discussed healthy lifestyle, diet, exercise, preventative care, vaccinations, and addressed patient's concerns.  Start ketoconazole for onychomycosis of his great toenails.  Will recheck in 2 weeks.  Counseled on situational anxiety, which patient reports he has with heights and going under bridges.  If this becomes more problematic for him I do not mind helping him with antianxiety medicine.  Patient will let me know if he ends up needing this.    Jaynee Eagles, PA-C [facility] [phone] 04/20/2018  10:23 AM

## 2018-04-20 NOTE — Patient Instructions (Addendum)
Mantenimiento de Scientist, forensic hombres (Health Maintenance, Male) Un estilo de vida saludable y los cuidados preventivos son importantes para la salud y Musician. Pregntele al mdico cul es el cronograma de exmenes peridicos adecuado para usted. [place]? Consuma una dieta saludable  Coma muchas verduras, frutas, cereales integrales, productos lcteos con bajo contenido de grasa y Advertising account planner.  No consuma muchos alimentos de alto contenido de grasas slidas, azcares agregados o sal. Mantenga un peso saludable La actividad fsica habitual puede ayudarlo a Science writer o mantener un peso saludable. Deber hacer lo siguiente:  Realizar al menos 127minutos de actividad fsica por semana. El ejercicio debe aumentar la frecuencia cardaca y Actor la transpiracin (ejercicio de [place]).  Hacer ejercicios de entrenamiento de fuerza por lo [employer] por semana. Controlarse los niveles de colesterol y lpidos en la sangre  Hgase anlisis de sangre para controlar los lpidos y el colesterol cada 5aos a partir de los 35aos. Si tiene un riesgo alto de Best boy cardiopatas coronarias, debe comenzar a [employer] de [place] a los [place]. Es posible que Automotive engineer los niveles de colesterol con mayor frecuencia si: ? Sus niveles de lpidos y colesterol son altos. ? Es mayor de 42HCW. ? Tiene un riesgo alto de tener cardiopatas coronarias. QU DEBO SABER SOBRE LAS PRUEBAS DE [place]? Muchos tipos de cncer se pueden detectar de manera temprana y a menudo prevenirse. Cncer de pulmn  Debe someterse a pruebas de deteccin de cncer de pulmn todos los aos en los siguientes casos: ? Si fuma actualmente y lo ha hecho durante por lo menos 30aos. ? Si fue fumador que dej el hbito en el trmino de los ltimos 15aos.  Hable con el mdico sobre las opciones en relacin con los estudios de deteccin, cundo  debe comenzar a Insurance underwriter y con Armed forces operational officer. Cncer colorrectal  Generalmente, las pruebas de deteccin habituales del cncer colorrectal comienzan a los 50aos y deben repetirse cada 5 a 10aos hasta los 75aos. Es posible que tenga que hacerse las pruebas con mayor frecuencia si se detectan formas tempranas de plipos precancerosos o pequeos bultos. Sin embargo, el mdico podr aconsejarle que lo haga antes, si tiene factores de riesgo para el cncer de colon.  El mdico puede recomendarle que use kits de prueba caseros para Publishing rights manager oculta en la materia fecal.  Se puede usar una pequea cmara en el extremo de un tubo para examinar el colon (sigmoidoscopia o colonoscopia). Este estudio [employer] formas ms tempranas de Surveyor, minerals. Cncer de prstata y de testculo  En funcin de la edad y del Stuart de salud general, el mdico puede realizarle determinados estudios de deteccin del cncer de prstata y de testculo.  Hable con el mdico sobre cualquier sntoma o acerca de las inquietudes que tenga sobre el cncer de prstata o de testculo. Cncer de piel  Revise la piel de la cabeza a los pies con regularidad.  Informe al mdico si aparecen nuevos lunares o si nota cambios en los que ya tiene, especialmente en estos casos: ? Si hay un cambio en el tamao, la forma o el color del lunar. ? Si tiene un lunar que es ms grande que el tamao de una goma de Games developer.  Siempre use pantalla solar. Aplquese pantalla solar de Lanell Matar y repetida a lo largo del Training and development officer.  Use mangas y [employer], un sombrero de ala ancha y gafas para  el sol cuando est al aire libre, para protegerse. QU DEBO SABER SOBRE LAS CARDIOPATAS CORONARIAS, LA DIABETES Y LA HIPERTENSIN ARTERIAL?  Si usted tiene entre 18 y 39aos, debe medirse la presin arterial cada 3a 5aos. Si usted tiene 40aos o ms, debe medirse la presin arterial todos los aos. Debe medirse la presin arterial  dos veces: una vez cuando est en un hospital o una clnica y la otra vez cuando est en otro sitio. Registre el promedio de las dos mediciones. Para controlar su presin arterial cuando no est en un hospital o una clnica, puede usar lo siguiente: ? Una mquina automtica para medir la presin arterial en una farmacia. ? Un monitor para medir la presin arterial en el hogar.  Hable con el mdico sobre los valores ideales de la presin arterial.  Si tiene entre 45 y 79aos, consltele al mdico si debe tomar aspirina para evitar las cardiopatas coronarias.  Hgase anlisis habituales de deteccin de la diabetes; para ello, contrlese la glucemia en ayunas. ? Si su peso es normal y tiene un bajo riesgo de padecer diabetes, realcese este anlisis cada tres aos despus de los 45aos. ? Si tiene sobrepeso y un alto riesgo de padecer diabetes, considere someterse a este anlisis antes o con mayor frecuencia.  Para los hombres que tienen entre 65 y 75aos, y son o han sido fumadores, se recomienda un nico estudio con ecografa para detectar un aneurisma de aorta abdominal (AAA). QU DEBO SABER SOBRE LA PREVENCIN DE LAS INFECCIONES? HepatitisB Si tiene un riesgo ms alto de contraer hepatitis B, debe someterse a un examen de deteccin de este virus. Hable con el mdico para determinar si corre riesgo de tener una infeccin por hepatitisB. Hepatitis C Se recomienda un anlisis de sangre para:  Todos los que nacieron entre 1945 y 1965.  Todas las personas que tengan un riesgo de haber contrado hepatitis C. Enfermedades de transmisin sexual (ETS)  Debe realizarse pruebas de deteccin de las ETS todos los aos, incluidas la gonorrea y la clamidia, en estos casos: ? Es sexualmente activo y es menor de 24aos. ? Es mayor de 24aos, y el mdico le informa que corre riesgo de tener este tipo de infecciones. ? La actividad sexual ha cambiado desde que le hicieron la ltima prueba de  deteccin y tiene un riesgo mayor de tener clamidia o gonorrea. Pregntele al mdico si usted tiene riesgo.  Consulte a su mdico para saber si tiene un alto riesgo de infectarse por el VIH. El mdico puede recomendarle un medicamento de venta con receta para ayudar a evitar la infeccin por el VIH. QU MS PUEDO HACER?  Realcese los estudios de rutina de la salud, dentales y de la vista.  Mantngase al da con las vacunas (inmunizaciones).  No consuma ningn producto que contenga tabaco, lo que incluye cigarrillos, tabaco de mascar y cigarrillos electrnicos. Si necesita ayuda para dejar de fumar, consulte al mdico.  Limite el consumo de alcohol a no ms de 2medidas por da. Una medida equivale a 12 onzas de cerveza, 5onzas de vino o 1onzas de bebidas alcohlicas de alta graduacin.  No consuma drogas.  No comparta agujas.  Solicite ayuda a su mdico si necesita apoyo o informacin para abandonar las drogas.  Informe a su mdico si a menudo se siente deprimido.  Notifique a su mdico si alguna vez ha sido vctima de abuso o si no se siente seguro en su hogar. Esta informacin no tiene como fin reemplazar el   consejo del mdico. Asegrese de hacerle al mdico cualquier pregunta que tenga. Document Released: 05/25/2008 Document Revised: 12/18/2014 Document Reviewed: 08/31/2015 Elsevier Interactive Patient Education  2018 [place].     Infeccin por hongos en las uas (Fungal Nail Infection) La infeccin por hongos en las uas es una infeccin por hongos frecuente en las uas de los pies o de las manos. Este trastorno [employer] uas de los pies con ms frecuencia que las uas de las manos. Ms de Ardelia Mems ua puede infectarse. Esta afeccin puede transmitirse de [place] persona a otra (es  contagiosa). CAUSAS La causa de esta afeccin es un hongo. Existen distintos tipos de hongos que pueden causar la infeccin. Estos hongos son ms frecuentes en las zonas hmedas y clidas. Si las  manos o los pies entran en contacto con los hongos, se pueden introducir en una ruptura de las uas de las manos o de los pies y Immunologist infeccin. [place] hacer que usted sea propenso a sufrir esta afeccin:  Ser varn.  Tener diabetes.  Ser Ardelia Mems persona de edad avanzada.  Convivir con alguien que tiene hongos.  Caminar descalzo en zonas donde proliferan hongos, como duchas o vestuarios.  Tener mala circulacin.  Usar zapatos y calcetines que [employer].  Tener pie de atleta.  Tener una ua lastimada o antecedentes recientes de una ciruga de uas.  Tener psoriasis.  Debilitamiento del sistema de defensa del cuerpo (sistema inmunitario). SNTOMAS Los sntomas de esta afeccin incluyen lo siguiente:  Etta Grandchild plida sobre la ua.  Engrosamiento de la ua.  Una ua que se torna amarilla o [place].  [place] uas rugosos o quebradizos.  Una ua que se cae.  Una ua que se ha desprendido del lecho ungueal. DIAGNSTICO Esta afeccin se diagnostica mediante un examen fsico. El mdico podr tomar una muestra de la ua para examinarla y Hydrographic surveyor si tiene hongos. TRATAMIENTO Las infecciones leves no necesitan tratamiento. Si tiene [employer] uas, el tratamiento puede incluir lo siguiente:  Medicamentos antimicticos por va oral. Deber tomar los medicamentos durante algunas semanas o meses y no ver los resultados hasta despus de un largo [place]. Estos medicamentos pueden tener efectos secundarios. Consulte al [employer] a los que debe estar atento.  Cremas y esmaltes para uas antimicticos. Se pueden usar junto con los medicamentos antimicticos que se administran por va oral.  Tratamiento lser de las uas.  Ciruga para extirpar la ua. Esto puede ser [employer] casos ms graves de infecciones. El tratamiento es muy [place] y la infeccin Wellsite geologist. INSTRUCCIONES PARA EL CUIDADO EN EL HOGAR Medicamentos  Tome o aplquese los medicamentos de venta libre y TEFL teacher se lo haya indicado el mdico.  Consulte al mdico sobre el uso de pomadas mentoladas para las uas de [place]. Estilo de vida  No comparta elementos personales como toallas o cortauas.  Crtese las uas con frecuencia.  Lvese y [place] y los pies todos [place].  Use calcetines absorbentes y cmbiese los calcetines con frecuencia.  Use un tipo de calzado que permita que el aire [place], como sandalias o zapatillas de lona. Deseche los zapatos viejos.  Use guantes de goma si est trabajando con sus manos en lugares mojados.  No camine descalzo en duchas o vestuarios.  No concurra a un saln de cosmtica de uas si no usan instrumentos limpios.  No use uas  artificiales. Instrucciones generales  Concurra a todas las visitas de control como se lo haya indicado el mdico. Esto es importante.  Aplquese polvo antimictico en los pies y en los zapatos. SOLICITE ATENCIN MDICA SI: La infeccin no mejora o si empeora despus de varios meses. Esta informacin no tiene Marine scientist el consejo del mdico. Asegrese de hacerle al mdico cualquier pregunta que tenga. Document Released: 09/06/2005 Document Revised: 03/20/2016 Document Reviewed: 05/31/2015 Elsevier Interactive Patient Education  2018 [employer].     IF you received an x-ray today, you will receive an invoice from [facility]. Please contact [facility] at [phone] with questions or concerns regarding your invoice.   IF you received labwork today, you will receive an invoice from [place]. Please contact [facility] at [phone] with questions or concerns regarding your invoice.   Our billing staff will not be able to assist you with questions regarding bills from these companies.  You will be contacted with the lab results as  soon as they are available. The fastest way to get your results is to activate your My Chart account. Instructions are located on the last page of this paperwork. If you have not heard from [place] regarding the results in 2 weeks, please contact this office.

## 2018-04-21 LAB — COMPREHENSIVE METABOLIC PANEL
ALBUMIN: 4.4 g/dL (ref 3.5–5.5)
ALT: 86 IU/L — AB (ref 0–44)
AST: 42 IU/L — ABNORMAL HIGH (ref 0–40)
Albumin/Globulin Ratio: 1.6 (ref 1.2–2.2)
Alkaline Phosphatase: 90 IU/L (ref 39–117)
BUN / CREAT RATIO: 12 (ref 9–20)
BUN: 12 mg/dL (ref 6–24)
Bilirubin Total: 0.4 mg/dL (ref 0.0–1.2)
CALCIUM: 10.1 mg/dL (ref 8.7–10.2)
CO2: 23 mmol/L (ref 20–29)
Chloride: 102 mmol/L (ref 96–106)
Creatinine, Ser: 1.01 mg/dL (ref 0.76–1.27)
GFR, EST AFRICAN AMERICAN: 106 mL/min/{1.73_m2} (ref >59)
GFR, EST NON AFRICAN AMERICAN: 91 mL/min/{1.73_m2} (ref >59)
GLUCOSE: 87 mg/dL (ref 65–99)
Globulin, Total: 2.7 g/dL (ref 1.5–4.5)
Potassium: 4.6 mmol/L (ref 3.5–5.2)
Sodium: 142 mmol/L (ref 134–144)
TOTAL PROTEIN: 7.1 g/dL (ref 6.0–8.5)

## 2018-04-21 LAB — CBC
HEMATOCRIT: 44.6 % (ref 37.5–51.0)
Hemoglobin: 14.3 g/dL (ref 13.0–17.7)
MCH: 29.2 pg (ref 26.6–33.0)
MCHC: 32.1 g/dL (ref 31.5–35.7)
MCV: 91 fL (ref 79–97)
PLATELETS: 475 10*3/uL — AB (ref 150–379)
RBC: 4.9 x10E6/uL (ref 4.14–5.80)
RDW: 15.2 % (ref 12.3–15.4)
WBC: 6 10*3/uL (ref 3.4–10.8)

## 2018-04-21 LAB — LIPID PANEL
CHOL/HDL RATIO: 3.7 ratio (ref 0.0–5.0)
Cholesterol, Total: 209 mg/dL — ABNORMAL HIGH (ref 100–199)
HDL: 57 mg/dL (ref >39)
LDL Calculated: 124 mg/dL — ABNORMAL HIGH (ref 0–99)
TRIGLYCERIDES: 138 mg/dL (ref 0–149)
VLDL Cholesterol Cal: 28 mg/dL (ref 5–40)

## 2018-04-21 LAB — RPR: RPR Ser Ql: NONREACTIVE

## 2018-04-21 LAB — HIV ANTIBODY (ROUTINE TESTING W REFLEX): HIV Screen 4th Generation wRfx: NONREACTIVE

## 2018-04-21 LAB — TSH: TSH: 0.842 u[IU]/mL (ref 0.450–4.500)

## 2018-04-22 LAB — GC/CHLAMYDIA PROBE AMP
Chlamydia trachomatis, NAA: NEGATIVE
Neisseria gonorrhoeae by PCR: NEGATIVE

## 2018-04-22 LAB — TRICHOMONAS VAGINALIS, PROBE AMP: Trich vag by NAA: NEGATIVE

## 2018-05-01 ENCOUNTER — Other Ambulatory Visit: Payer: Self-pay | Admitting: Urgent Care

## 2018-05-02 NOTE — Telephone Encounter (Signed)
LOV  04/20/18  [name] Last refill  04/20/18

## 2018-05-17 ENCOUNTER — Other Ambulatory Visit: Payer: Self-pay

## 2018-05-17 ENCOUNTER — Ambulatory Visit (INDEPENDENT_AMBULATORY_CARE_PROVIDER_SITE_OTHER): Payer: BLUE CROSS/BLUE SHIELD | Admitting: Emergency Medicine

## 2018-05-17 ENCOUNTER — Encounter: Payer: Self-pay | Admitting: Emergency Medicine

## 2018-05-17 VITALS — BP 108/68 | HR 68 | Temp 97.9°F | Resp 16 | Ht 66.0 in | Wt 167.4 lb

## 2018-05-17 DIAGNOSIS — K760 Fatty (change of) liver, not elsewhere classified: Secondary | ICD-10-CM | POA: Diagnosis not present

## 2018-05-17 DIAGNOSIS — R748 Abnormal levels of other serum enzymes: Secondary | ICD-10-CM | POA: Diagnosis not present

## 2018-05-17 NOTE — Patient Instructions (Addendum)
IF you received an x-ray today, you will receive an invoice from [facility] Radiology. Please contact [facility] Radiology at [phone] with questions or concerns regarding your invoice.   IF you received labwork today, you will receive an invoice from [place]. Please contact [facility] at [phone] with questions or concerns regarding your invoice.   Our billing staff will not be able to assist you with questions regarding bills from these companies.  You will be contacted with the lab results as soon as they are available. The fastest way to get your results is to activate your My Chart account. Instructions are located on the last page of this paperwork. If you have not heard from [place] regarding the results in 2 weeks, please contact this office.     Hgado graso (Fatty Liver) El hgado graso, tambin llamado esteatosis heptica o esteatohepatitis, es una enfermedad en la que se acumula demasiada grasa en las clulas del hgado. El hgado elimina las sustancias dainas del torrente sanguneo y produce lquidos que el cuerpo necesita. Tambin ayuda al cuerpo a Risk manager y Financial controller la energa obtenida de los alimentos que come. En muchos casos, el hgado graso no provoca sntomas ni problemas. Con frecuencia, se diagnostica cuando se realizan estudios por otros motivos. Sin embargo, con Physiological scientist, el hgado graso puede provocar una inflamacin que puede causar problemas hepticos ms graves, como fibrosis heptica (cirrosis). CAUSAS Las causas del hgado graso pueden incluir las siguientes:  Beber alcohol en exceso.  Dficit nutricional.  Obesidad.  Sndrome de [place].  Diabetes.  Hiperlipidemia.  Embarazo.  Determinados medicamentos.  Txicos.  Algunas infecciones virales. FACTORES DE RIESGO Puede tener ms probabilidades de tener hgado graso si:  Consume alcohol en exceso.  Est embarazada.  Tiene sobrepeso.  Tiene diabetes.  Tiene hepatitis.  Tiene un  nivel alto de triglicridos. Webber hgado graso con frecuencia no provoca sntomas. En los casos en que se presentan sntomas, estos pueden incluir:  Fatiga.  Debilidad.  Prdida de peso.  Confusin.  Dolor abdominal.  Color amarillo en la piel y en la zona blanca de los ojos (ictericia).  Nuseas y vmitos. DIAGNSTICO El hgado graso se puede diagnosticar mediante lo siguiente:  Un examen fsico y Ardelia Mems historia clnica.  Anlisis de [place].  Estudios de diagnstico por imgenes, como ecografa, tomografa computarizada (TC) o Health visitor (RM).  Biopsia de hgado. Se extrae una pequea muestra de tejido del hgado usando [place]. La muestra se examina en el microscopio. TRATAMIENTO El hgado graso con frecuencia es causado por otras enfermedades. El tratamiento para el hgado graso puede incluir medicamentos y cambios en el estilo de vida para controlar enfermedades como:  Alcoholismo.  Colesterol elevado.  Diabetes.  Exceso de [place] u obesidad. INSTRUCCIONES PARA EL CUIDADO EN EL HOGAR  Siga una dieta saludable como se lo haya indicado el mdico.  Haga ejercicios regularmente. Esto puede ayudarlo a [employer], y a Academic librarian y la diabetes. Hable con el mdico sobre qu actividades son mejores para usted y Audiological scientist de [place].  No beba alcohol.  Tome los medicamentos solamente como se lo haya indicado el mdico. SOLICITE ATENCIN MDICA SI: Tiene dificultad para controlar lo siguiente:  Nivel de Dispensing optician.  Colesterol.  Consumo de alcohol. SOLICITE ATENCIN MDICA DE INMEDIATO SI:  Siente dolor abdominal.  Tiene ictericia.  Tiene nuseas o vmitos. Esta informacin no tiene Marine scientist el consejo del mdico. Asegrese de hacerle  al mdico cualquier pregunta que tenga. Document Released: 09/17/2013 Document Revised: 12/18/2014 Document Reviewed: 04/08/2014 Elsevier Interactive Patient  Education  [employer].

## 2018-05-17 NOTE — Progress Notes (Signed)
Omer Antonio Rios-Cruz 42 y.o.   Chief Complaint  Patient presents with  . lung function    per patient here to check liver    HISTORY OF PRESENT ILLNESS: This is a 42 y.o. male seen on 04/20/2018 for annual physical exam and found to have abnormal liver enzymes.  Here for follow-up.  No chronic medical problems.  Occasional weekend drinker.  Non-smoker.  Denies abdominal pain, nausea or vomiting, fever or chills.  No other significant symptoms.  Labs reviewed with patient. Liver enzymes most likely secondary to fatty liver.  HPI   Prior to Admission medications   Medication Sig Start Date End Date Taking? Authorizing Provider  ketoconazole (NIZORAL) 2 % cream Apply 1 application topically daily. Patient not taking: Reported on 05/17/2018 04/20/18   Jaynee Eagles, PA-C    No Known Allergies  Patient Active Problem List   Diagnosis Date Noted  . Seasonal allergies 04/06/2018  . Influenza 04/06/2018  . Generalized abdominal pain 04/06/2018    Past Medical History:  Diagnosis Date  . Cancer [facility])     Past Surgical History:  Procedure Laterality Date  . APPENDECTOMY      Social History   Socioeconomic History  . Marital status: Single    Spouse name: Not on file  . Number of children: Not on file  . Years of education: Not on file  . Highest education level: Not on file  Occupational History  . Not on file  Social Needs  . Financial resource strain: Not on file  . Food insecurity:    Worry: Not on file    Inability: Not on file  . Transportation needs:    Medical: Not on file    Non-medical: Not on file  Tobacco Use  . Smoking status: Never Smoker  . Smokeless tobacco: Never Used  Substance and Sexual Activity  . Alcohol use: Yes    Alcohol/week: 0.0 oz  . Drug use: Not on file  . Sexual activity: Not on file  Lifestyle  . Physical activity:    Days per week: Not on file    Minutes per session: Not on file  . Stress: Not on file  Relationships  .  Social connections:    Talks on phone: Not on file    Gets together: Not on file    Attends religious service: Not on file    Active member of club or organization: Not on file    Attends meetings of clubs or organizations: Not on file    Relationship status: Not on file  . Intimate partner violence:    Fear of current or ex partner: Not on file    Emotionally abused: Not on file    Physically abused: Not on file    Forced sexual activity: Not on file  Other Topics Concern  . Not on file  Social History Narrative  . Not on file    No family history on file.   Review of Systems  Constitutional: Negative.  Negative for chills, fever and malaise/fatigue.  HENT: Negative.   Eyes: Negative.  Negative for blurred vision and double vision.  Respiratory: Negative.  Negative for cough and shortness of breath.   Cardiovascular: Negative.  Negative for chest pain.  Gastrointestinal: Negative.  Negative for abdominal pain, diarrhea, nausea and vomiting.  Genitourinary: Negative.  Negative for dysuria and hematuria.  Musculoskeletal: Negative.  Negative for back pain, myalgias and neck pain.  Skin: Negative.  Negative for rash.  Neurological: Negative.  Negative for dizziness and headaches.  Endo/Heme/Allergies: Negative.   All other systems reviewed and are negative.   Vitals:   05/17/18 1635  BP: 108/68  Pulse: 68  Resp: 16  Temp: 97.9 F (36.6 C)  SpO2: 98%    Physical Exam  Constitutional: He is oriented to person, place, and time. He appears well-developed and well-nourished.  HENT:  Head: Normocephalic and atraumatic.  Nose: Nose normal.  Mouth/Throat: Oropharynx is clear and moist.  Eyes: Pupils are equal, round, and reactive to light. Conjunctivae and EOM are normal.  Neck: Normal range of motion. Neck supple.  Cardiovascular: Normal rate, regular rhythm and normal heart sounds.  Pulmonary/Chest: Effort normal and breath sounds normal.  Abdominal: Soft. Bowel sounds  are normal. He exhibits no distension. There is no tenderness.  Musculoskeletal: Normal range of motion.  Neurological: He is alert and oriented to person, place, and time. No sensory deficit. He exhibits normal muscle tone.  Skin: Skin is warm and dry. Capillary refill takes less than 2 seconds.  Psychiatric: He has a normal mood and affect. His behavior is normal.  Vitals reviewed.  A total of 25 minutes was spent in the room with the patient, greater than 50% of which was in counseling/coordination of care regarding differential diagnosis, fatty liver, management, medications, lifestyle choices, nutrition and need for follow-up.   ASSESSMENT & PLAN:  Blair was seen today for lung function.  Diagnoses and all orders for this visit:  Abnormal liver enzymes -     Comprehensive metabolic panel  Fatty liver    Patient Instructions       IF you received an x-ray today, you will receive an invoice from [facility]. Please contact [facility] at [phone] with questions or concerns regarding your invoice.   IF you received labwork today, you will receive an invoice from [place]. Please contact [facility] at [phone] with questions or concerns regarding your invoice.   Our billing staff will not be able to assist you with questions regarding bills from these companies.  You will be contacted with the lab results as soon as they are available. The fastest way to get your results is to activate your My Chart account. Instructions are located on the last page of this paperwork. If you have not heard from [place] regarding the results in 2 weeks, please contact this office.     Hgado graso (Fatty Liver) El hgado graso, tambin llamado esteatosis heptica o esteatohepatitis, es una enfermedad en la que se acumula demasiada grasa en las clulas del hgado. El hgado elimina las sustancias dainas del torrente sanguneo y produce lquidos que el cuerpo necesita.  Tambin ayuda al cuerpo a Risk manager y Financial controller la energa obtenida de los alimentos que come. En muchos casos, el hgado graso no provoca sntomas ni problemas. Con frecuencia, se diagnostica cuando se realizan estudios por otros motivos. Sin embargo, con Physiological scientist, el hgado graso puede provocar una inflamacin que puede causar problemas hepticos ms graves, como fibrosis heptica (cirrosis). CAUSAS Las causas del hgado graso pueden incluir las siguientes:  Beber alcohol en exceso.  Dficit nutricional.  Obesidad.  Sndrome de [place].  Diabetes.  Hiperlipidemia.  Embarazo.  Determinados medicamentos.  Txicos.  Algunas infecciones virales. FACTORES DE RIESGO Puede tener ms probabilidades de tener hgado graso si:  Consume alcohol en exceso.  Est embarazada.  Tiene sobrepeso.  Tiene diabetes.  Tiene hepatitis.  Tiene un nivel alto de triglicridos. Ada hgado graso con frecuencia no  provoca sntomas. En los casos en que se presentan sntomas, estos pueden incluir:  Fatiga.  Debilidad.  Prdida de peso.  Confusin.  Dolor abdominal.  Color amarillo en la piel y en la zona blanca de los ojos (ictericia).  Nuseas y vmitos. DIAGNSTICO El hgado graso se puede diagnosticar mediante lo siguiente:  Un examen fsico y Ardelia Mems historia clnica.  Anlisis de [place].  Estudios de diagnstico por imgenes, como ecografa, tomografa computarizada (TC) o Health visitor (RM).  Biopsia de hgado. Se extrae una pequea muestra de tejido del hgado usando [place]. La muestra se examina en el microscopio. TRATAMIENTO El hgado graso con frecuencia es causado por otras enfermedades. El tratamiento para el hgado graso puede incluir medicamentos y cambios en el estilo de vida para controlar enfermedades como:  Alcoholismo.  Colesterol elevado.  Diabetes.  Exceso de [place] u obesidad. INSTRUCCIONES PARA EL CUIDADO EN EL HOGAR  Siga una  dieta saludable como se lo haya indicado el mdico.  Haga ejercicios regularmente. Esto puede ayudarlo a [facility], y a Academic librarian y la diabetes. Hable con el mdico sobre qu actividades son mejores para usted y Audiological scientist de [place].  No beba alcohol.  Tome los medicamentos solamente como se lo haya indicado el mdico. SOLICITE ATENCIN MDICA SI: Tiene dificultad para controlar lo siguiente:  Nivel de Dispensing optician.  Colesterol.  Consumo de alcohol. SOLICITE ATENCIN MDICA DE INMEDIATO SI:  Siente dolor abdominal.  Tiene ictericia.  Tiene nuseas o vmitos. Esta informacin no tiene Marine scientist el consejo del mdico. Asegrese de hacerle al mdico cualquier pregunta que tenga. Document Released: 09/17/2013 Document Revised: 12/18/2014 Document Reviewed: 04/08/2014 Elsevier Interactive Patient Education  2018 Elsevier Inc.       Agustina Caroli, MD [facility]

## 2018-05-18 LAB — COMPREHENSIVE METABOLIC PANEL
ALBUMIN: 4.7 g/dL (ref 3.5–5.5)
ALT: 40 IU/L (ref 0–44)
AST: 29 IU/L (ref 0–40)
Albumin/Globulin Ratio: 2 (ref 1.2–2.2)
Alkaline Phosphatase: 76 IU/L (ref 39–117)
BUN / CREAT RATIO: 12 (ref 9–20)
BUN: 15 mg/dL (ref 6–24)
Bilirubin Total: 0.3 mg/dL (ref 0.0–1.2)
CALCIUM: 9.4 mg/dL (ref 8.7–10.2)
CO2: 23 mmol/L (ref 20–29)
CREATININE: 1.21 mg/dL (ref 0.76–1.27)
Chloride: 104 mmol/L (ref 96–106)
GFR, EST AFRICAN AMERICAN: 85 mL/min/{1.73_m2} (ref >59)
GFR, EST NON AFRICAN AMERICAN: 73 mL/min/{1.73_m2} (ref >59)
GLOBULIN, TOTAL: 2.4 g/dL (ref 1.5–4.5)
Glucose: 87 mg/dL (ref 65–99)
Potassium: 4.1 mmol/L (ref 3.5–5.2)
SODIUM: 142 mmol/L (ref 134–144)
TOTAL PROTEIN: 7.1 g/dL (ref 6.0–8.5)

## 2018-05-28 ENCOUNTER — Telehealth: Payer: Self-pay | Admitting: Emergency Medicine

## 2018-05-28 NOTE — Telephone Encounter (Signed)
Forwarded to Dr Mitchel Honour

## 2018-05-28 NOTE — Telephone Encounter (Signed)
Blood results discussed with patient.  Liver enzymes are normal.  Disregard previous letter stating that liver enzymes were persistently elevated.  CMP done on 05/17/2018 shows normal liver enzymes.  Patient made aware of this today.

## 2018-05-28 NOTE — Telephone Encounter (Signed)
Copied from [place] [id number]. Topic: General - Other >> May 28, 2018  9:35 AM Carolyn Stare wrote:  Pt would like a call back about his lab results he said he would for the doctor to call him   [phone]

## 2018-05-28 NOTE — Telephone Encounter (Signed)
Thanks, Tasha. Taken care of.

## 2019-03-25 DIAGNOSIS — H00022 Hordeolum internum right lower eyelid: Secondary | ICD-10-CM | POA: Diagnosis not present

## 2019-03-25 DIAGNOSIS — J309 Allergic rhinitis, unspecified: Secondary | ICD-10-CM | POA: Diagnosis not present

## 2019-11-26 DIAGNOSIS — R3129 Other microscopic hematuria: Secondary | ICD-10-CM | POA: Diagnosis not present

## 2019-11-26 DIAGNOSIS — R350 Frequency of micturition: Secondary | ICD-10-CM | POA: Diagnosis not present

## 2019-11-26 DIAGNOSIS — Z125 Encounter for screening for malignant neoplasm of prostate: Secondary | ICD-10-CM | POA: Diagnosis not present

## 2019-11-26 DIAGNOSIS — R5383 Other fatigue: Secondary | ICD-10-CM | POA: Diagnosis not present

## 2019-11-26 DIAGNOSIS — R0602 Shortness of breath: Secondary | ICD-10-CM | POA: Diagnosis not present

## 2019-11-26 DIAGNOSIS — Z0000 Encounter for general adult medical examination without abnormal findings: Secondary | ICD-10-CM | POA: Diagnosis not present

## 2019-11-26 DIAGNOSIS — Z131 Encounter for screening for diabetes mellitus: Secondary | ICD-10-CM | POA: Diagnosis not present

## 2019-11-26 DIAGNOSIS — Z03818 Encounter for observation for suspected exposure to other biological agents ruled out: Secondary | ICD-10-CM | POA: Diagnosis not present
